# Patient Record
Sex: Female | Born: 1980 | Race: Black or African American | Hispanic: No | Marital: Married | Smoking: Never smoker
Health system: Southern US, Community
[De-identification: ages and names within clinical notes are randomized; demographics above are authoritative.]

## PROBLEM LIST (undated history)

## (undated) ENCOUNTER — Ambulatory Visit: Admission: EM | Payer: No Typology Code available for payment source

## (undated) DIAGNOSIS — G43909 Migraine, unspecified, not intractable, without status migrainosus: Secondary | ICD-10-CM

## (undated) HISTORY — PX: CHOLECYSTECTOMY: SHX55

---

## 2006-07-14 ENCOUNTER — Ambulatory Visit: Payer: Self-pay | Admitting: Obstetrics and Gynecology

## 2006-07-14 ENCOUNTER — Inpatient Hospital Stay (HOSPITAL_COMMUNITY): Admission: AD | Admit: 2006-07-14 | Discharge: 2006-07-16 | Payer: Self-pay | Admitting: Obstetrics and Gynecology

## 2006-10-10 ENCOUNTER — Inpatient Hospital Stay (HOSPITAL_COMMUNITY): Admission: RE | Admit: 2006-10-10 | Discharge: 2006-10-10 | Payer: Self-pay | Admitting: Obstetrics & Gynecology

## 2008-11-01 ENCOUNTER — Inpatient Hospital Stay (HOSPITAL_COMMUNITY): Admission: AD | Admit: 2008-11-01 | Discharge: 2008-11-03 | Payer: Self-pay | Admitting: Obstetrics and Gynecology

## 2010-04-06 LAB — CBC
MCHC: 33.5 g/dL (ref 30.0–36.0)
RDW: 12.9 % (ref 11.5–15.5)

## 2010-04-07 LAB — GLUCOSE, CAPILLARY: Glucose-Capillary: 38 mg/dL — CL (ref 70–99)

## 2010-04-07 LAB — RPR: RPR Ser Ql: NONREACTIVE

## 2010-04-07 LAB — CBC
RBC: 3.48 MIL/uL — ABNORMAL LOW (ref 3.87–5.11)
WBC: 11.5 10*3/uL — ABNORMAL HIGH (ref 4.0–10.5)

## 2010-10-13 LAB — URINALYSIS, ROUTINE W REFLEX MICROSCOPIC
Bilirubin Urine: NEGATIVE
Glucose, UA: NEGATIVE
Hgb urine dipstick: NEGATIVE
Ketones, ur: NEGATIVE
Nitrite: NEGATIVE
Protein, ur: NEGATIVE
Specific Gravity, Urine: 1.025
Urobilinogen, UA: 1
pH: 7

## 2010-10-13 LAB — GC/CHLAMYDIA PROBE AMP, GENITAL
Chlamydia, DNA Probe: POSITIVE — AB
GC Probe Amp, Genital: NEGATIVE

## 2010-10-13 LAB — WET PREP, GENITAL
Trich, Wet Prep: NONE SEEN
Yeast Wet Prep HPF POC: NONE SEEN

## 2010-10-13 LAB — POCT PREGNANCY, URINE
Operator id: 120561
Preg Test, Ur: NEGATIVE

## 2010-10-18 LAB — CBC
HCT: 28.3 — ABNORMAL LOW
HCT: 30 — ABNORMAL LOW
Hemoglobin: 10.2 — ABNORMAL LOW
MCHC: 33.2
Platelets: 207
Platelets: 212
RDW: 13
WBC: 10.7 — ABNORMAL HIGH

## 2010-10-18 LAB — RAPID URINE DRUG SCREEN, HOSP PERFORMED
Barbiturates: NOT DETECTED
Cocaine: NOT DETECTED
Opiates: NOT DETECTED
Tetrahydrocannabinol: NOT DETECTED

## 2020-11-12 ENCOUNTER — Emergency Department (INDEPENDENT_AMBULATORY_CARE_PROVIDER_SITE_OTHER)
Admission: EM | Admit: 2020-11-12 | Discharge: 2020-11-12 | Disposition: A | Discharge status: Home / Self Care | Payer: BC Managed Care – PPO | Source: Home / Self Care | Attending: Family Medicine | Admitting: Family Medicine

## 2020-11-12 ENCOUNTER — Other Ambulatory Visit: Payer: Self-pay

## 2020-11-12 ENCOUNTER — Emergency Department (INDEPENDENT_AMBULATORY_CARE_PROVIDER_SITE_OTHER): Payer: BC Managed Care – PPO

## 2020-11-12 ENCOUNTER — Emergency Department: Admit: 2020-11-12 | Discharge status: Absent without leave | Payer: Self-pay

## 2020-11-12 DIAGNOSIS — N939 Abnormal uterine and vaginal bleeding, unspecified: Secondary | ICD-10-CM

## 2020-11-12 HISTORY — DX: Migraine, unspecified, not intractable, without status migrainosus: G43.909

## 2020-11-12 LAB — POCT URINALYSIS DIP (MANUAL ENTRY)
Bilirubin, UA: NEGATIVE
Glucose, UA: NEGATIVE mg/dL
Ketones, POC UA: NEGATIVE mg/dL
Leukocytes, UA: NEGATIVE
Nitrite, UA: NEGATIVE
Protein Ur, POC: NEGATIVE mg/dL
Spec Grav, UA: 1.025 (ref 1.010–1.025)
Urobilinogen, UA: 0.2 E.U./dL
pH, UA: 7.5 (ref 5.0–8.0)

## 2020-11-12 LAB — POCT URINE PREGNANCY: Preg Test, Ur: NEGATIVE

## 2020-11-12 NOTE — Discharge Instructions (Signed)
Try taking Ibuprofen 200mg , 4 tabs every 8 hours with food for 1 to 2 days (stop taking if symptoms become worse).

## 2020-11-12 NOTE — ED Provider Notes (Signed)
[facility]    CSN: [id number] Arrival date & time: 11/12/20  [id number]      History   Chief Complaint Chief Complaint  Patient presents with   Vaginal Bleeding   Back Pain    HPI Jamie Harrison is a 40 y.o. female.   Patient has a history of recurring intermenstrual vaginal bleeding.  She reports normal periods however.  She visited a GYN in July 2022 where a Pap smear was negative.   Patient's last menstrual period was 10/23/2020 (exact date).  She denies abdominal or pelvic pain.  Yesterday she developed recurrent vaginal bleeding with lower back ache and was concerned because she passed some blood clots today.  She denies nausea/vomiting, fevers, chills, and sweats, and urinary symptoms and feels well otherwise. She denies history of bleeding disorder. Review of records reveals a normal Hgb 13.3 eleven months ago.   The history is provided by the patient.  Vaginal Bleeding Quality:  Dark red, spotting and clots Severity:  Mild Onset quality:  Sudden Duration:  1 day Timing:  Intermittent Progression:  Unchanged Chronicity:  Chronic Menstrual history:  Regular Possible pregnancy: no   Context: at rest and spontaneously   Relieved by:  None tried Worsened by:  Nothing Ineffective treatments:  None tried Associated symptoms: back pain   Associated symptoms: no abdominal pain, no dysuria, no fatigue, no fever, no nausea and no vaginal discharge   Risk factors: no bleeding disorder    Past Medical History:  Diagnosis Date   Migraines     There are no problems to display for this patient.   Past Surgical History:  Procedure Laterality Date   CHOLECYSTECTOMY      OB History   No obstetric history on file.      Home Medications    Prior to Admission medications   Medication Sig Start Date End Date Taking? Authorizing Provider  Atogepant (QULIPTA PO) Take by mouth.   Yes [provider]  Ubrogepant (UBRELVY PO) Take by mouth.   Yes  [provider]    Family History History reviewed. No pertinent family history.  Social History Social History   Tobacco Use   Smoking status: Never   Smokeless tobacco: Never     Allergies   Patient has no known allergies.   Review of Systems Review of Systems  Constitutional:  Negative for appetite change, chills, diaphoresis, fatigue and fever.  Gastrointestinal:  Negative for abdominal pain, nausea and vomiting.  Genitourinary:  Positive for vaginal bleeding. Negative for dysuria, flank pain, frequency, genital sores, hematuria, pelvic pain, urgency, vaginal discharge and vaginal pain.  Musculoskeletal:  Positive for back pain.  Neurological:  Negative for headaches.  Hematological:  Negative for adenopathy.    Physical Exam Triage Vital Signs ED Triage Vitals  Enc Vitals Group     BP 11/12/20 1045 (!) 141/91     Pulse Rate 11/12/20 1045 70     Resp 11/12/20 1045 12     Temp 11/12/20 1045 98.2 F (36.8 C)     Temp Source 11/12/20 1045 Oral     SpO2 11/12/20 1045 100 %     Weight --      Height --      Head Circumference --      Peak Flow --      Pain Score 11/12/20 1047 1     Pain Loc --      Pain Edu? --      Excl. in  GC? --    No data found.  Updated Vital Signs BP (!) 141/91 (BP Location: Right Arm)   Pulse 70   Temp 98.2 F (36.8 C) (Oral)   Resp 12   LMP 10/23/2020 (Exact Date)   SpO2 100%   Visual Acuity Right Eye Distance:   Left Eye Distance:   Bilateral Distance:    Right Eye Near:   Left Eye Near:    Bilateral Near:     Physical Exam Vitals and nursing note reviewed. Exam conducted with a chaperone present.  Constitutional:      General: She is not in acute distress. HENT:     Head: Normocephalic.     Nose: Nose normal.     Mouth/Throat:     Mouth: Mucous membranes are moist.     Pharynx: Oropharynx is clear.  Eyes:     Conjunctiva/sclera: Conjunctivae normal.     Pupils: Pupils are equal, round, and reactive to  light.  Cardiovascular:     Rate and Rhythm: Normal rate and regular rhythm.     Heart sounds: Normal heart sounds.  Pulmonary:     Breath sounds: Normal breath sounds.  Abdominal:     Palpations: Abdomen is soft. There is no mass.     Tenderness: There is no abdominal tenderness. There is no right CVA tenderness, left CVA tenderness or guarding.     Hernia: There is no hernia in the left inguinal area or right inguinal area.  Genitourinary:    General: Normal vulva.     Exam position: Lithotomy position.     Pubic Area: No rash.      Labia:        Right: No rash or tenderness.        Left: No rash or tenderness.      Vagina: Normal.     Cervix: No discharge.     Uterus: Normal. Not enlarged and not tender.      Adnexa: Right adnexa normal and left adnexa normal.       Right: No tenderness.         Left: No tenderness.       Comments: Cervical os has blood present, otherwise no discharge. Musculoskeletal:     Cervical back: Neck supple.     Right lower leg: No edema.     Left lower leg: No edema.  Lymphadenopathy:     Cervical: No cervical adenopathy.     Lower Body: No right inguinal adenopathy. No left inguinal adenopathy.  Skin:    General: Skin is warm and dry.     Findings: No rash.  Neurological:     Mental Status: She is alert and oriented to person, place, and time.     UC Treatments / Results  Labs (all labs ordered are listed, but only abnormal results are displayed) Labs Reviewed  POCT URINALYSIS DIP (MANUAL ENTRY) - Abnormal; Notable for the following components:      Result Value   Blood, UA trace-intact (*)    All other components within normal limits  POCT URINE PREGNANCY negative    EKG   Radiology US Pelvis Complete  Result Date: 11/12/2020 CLINICAL DATA:  Lower abdominal pain and vaginal bleeding. EXAM: TRANSABDOMINAL ULTRASOUND OF PELVIS TECHNIQUE: Transabdominal ultrasound examination of the pelvis was performed including evaluation of the  uterus, ovaries, adnexal regions, and pelvic cul-de-sac. COMPARISON:  None. FINDINGS: Uterus Measurements: 9.6 x 4.7 x 5.4 cm = volume: 127 mL. No fibroids or other  mass visualized. Endometrium Thickness: 10 mm. 7 mm subendometrial cyst. No other focal abnormality visualized. Right ovary Measurements: 4.6 x 2.2 x 2.4 cm = volume: 13.2 mL. Normal appearance/no adnexal mass. Left ovary Measurements: 3.5 x 3.1 x 2.8 cm = volume: 16.0 mL. Normal appearance/no adnexal mass. Other findings:  No abnormal free fluid. IMPRESSION: 1. Essentially normal pelvic ultrasound. Electronically Signed   By: Titus Dubin M.D.   On: 11/12/2020 14:21    Procedures Procedures (including critical care time)  Medications Ordered in UC Medications - No data to display  Initial Impression / Assessment and Plan / UC Course  I have reviewed the triage vital signs and the nursing notes.  Pertinent labs & imaging results that were available during my care of the patient were reviewed by me and considered in my medical decision making (see chart for details).    Normal pelvic US reassuring. Recommend GYN follow-up.  Final Clinical Impressions(s) / UC Diagnoses   Final diagnoses:  Vaginal bleeding     Discharge Instructions      Try taking Ibuprofen 200mg , 4 tabs every 8 hours with food for 1 to 2 days (stop taking if symptoms become worse).   ED Prescriptions   None       Kandra Nicolas, MD 11/14/20 2107

## 2020-11-12 NOTE — ED Triage Notes (Signed)
Pt presents with vaginal bleeding and lower back pain that began yesterday

## 2021-04-11 ENCOUNTER — Encounter (HOSPITAL_COMMUNITY): Payer: Self-pay

## 2021-04-11 ENCOUNTER — Emergency Department (HOSPITAL_COMMUNITY)
Admission: EM | Admit: 2021-04-11 | Discharge: 2021-04-11 | Disposition: A | Discharge status: Home / Self Care | Payer: Federal, State, Local not specified - PPO | Attending: Emergency Medicine | Admitting: Emergency Medicine

## 2021-04-11 DIAGNOSIS — R519 Headache, unspecified: Secondary | ICD-10-CM | POA: Diagnosis present

## 2021-04-11 DIAGNOSIS — R110 Nausea: Secondary | ICD-10-CM | POA: Diagnosis not present

## 2021-04-11 MED ORDER — KETOROLAC TROMETHAMINE 15 MG/ML IJ SOLN
30.0000 mg | Freq: Once | INTRAMUSCULAR | Status: AC
  Administered 2021-04-11: 30 mg via INTRAMUSCULAR
  Filled 2021-04-11: qty 2

## 2021-04-11 NOTE — ED Triage Notes (Signed)
Pt presents with c/o migraine. Pt reports pain since yesterday, hx of same, no relief with home meds.  ?

## 2021-04-11 NOTE — ED Provider Notes (Signed)
?[facility]-EMERGENCY DEPT ?Provider Note ? ? ?CSN: [id number] ?Arrival date & time: 04/11/21  2831 ? ?  ? ?History ? ?Chief Complaint  ?Patient presents with  ? Migraine  ? ? ?Jamie Harrison is a 41 y.o. female. ? ?Patient, seen by myself earlier in triage, presents for evaluation of headache which she describes as a migraine headache.  She has a history of same.  Home medications have not been helping.  Current headache started last night.  It is frontal.  She has associated nausea without vomiting, light sensitivity.  No head injuries, confusion, neck pain or fever.  She drove herself to the emergency department today and does not have a ride home.   ?  ? ? ?  ? ?Home Medications ?Prior to Admission medications   ?Medication Sig Start Date End Date Taking? Authorizing Provider  ?Atogepant (QULIPTA PO) Take by mouth.    [provider]  ?Ubrogepant (UBRELVY PO) Take by mouth.    [provider]  ?   ? ?Allergies    ?Patient has no known allergies.   ? ?Review of Systems   ?Review of Systems ? ?Physical Exam ?Updated Vital Signs ?BP (!) 150/104   Pulse 80   Temp 98.3 ?F (36.8 ?C) (Oral)   Resp 17   SpO2 100%  ?Physical Exam ?Vitals and nursing note reviewed.  ?Constitutional:   ?   Appearance: She is well-developed.  ?HENT:  ?   Head: Normocephalic and atraumatic.  ?   Right Ear: Tympanic membrane, ear canal and external ear normal.  ?   Left Ear: Tympanic membrane, ear canal and external ear normal.  ?   Nose: Nose normal.  ?   Mouth/Throat:  ?   Pharynx: Uvula midline.  ?Eyes:  ?   General: Lids are normal.  ?   Extraocular Movements:  ?   Right eye: No nystagmus.  ?   Left eye: No nystagmus.  ?   Conjunctiva/sclera: Conjunctivae normal.  ?   Pupils: Pupils are equal, round, and reactive to light.  ?Cardiovascular:  ?   Rate and Rhythm: Normal rate and regular rhythm.  ?Pulmonary:  ?   Effort: Pulmonary effort is normal.  ?   Breath sounds: Normal breath sounds.   ?Abdominal:  ?   Palpations: Abdomen is soft.  ?   Tenderness: There is no abdominal tenderness.  ?Musculoskeletal:  ?   Cervical back: Normal range of motion and neck supple. No tenderness or bony tenderness.  ?Skin: ?   General: Skin is warm and dry.  ?Neurological:  ?   Mental Status: She is alert and oriented to person, place, and time.  ?   GCS: GCS eye subscore is 4. GCS verbal subscore is 5. GCS motor subscore is 6.  ?   Cranial Nerves: No cranial nerve deficit.  ?   Sensory: No sensory deficit.  ?   Motor: No weakness.  ?   Coordination: Coordination normal.  ?   Gait: Gait normal.  ?   Comments: Upper extremity myotomes tested bilaterally:  ?C5 Shoulder abduction 5/5 ?C6 Elbow flexion/wrist extension 5/5 ?C7 Elbow extension 5/5 ?C8 Finger flexion 5/5 ?T1 Finger abduction 5/5 ? ?Lower extremity myotomes tested bilaterally: ?L2 Hip flexion 5/5 ?L3 Knee extension 5/5 ?L4 Ankle dorsiflexion 5/5 ?S1 Ankle plantar flexion 5/5 ?  ? ? ?ED Results / Procedures / Treatments   ?Labs ?(all labs ordered are listed, but only abnormal results are displayed) ?Labs Reviewed -  No data to display ? ?EKG ?None ? ?Radiology ?No results found. ? ?Procedures ?Procedures  ? ? ?Medications Ordered in ED ?Medications  ?ketorolac (TORADOL) 15 MG/ML injection 30 mg (30 mg Intramuscular Given 04/11/21 1327)  ? ? ?ED Course/ Medical Decision Making/ A&P ?  ? ?Patient seen and examined.  While waiting in the emergency department waiting room, she states that her headache is improving.  Currently 3/10. ? ?Medications/Fluids: Ordered: Toradol  ? ?Most recent vital signs reviewed and are as follows: ?BP (!) 150/104   Pulse 80   Temp 98.3 ?F (36.8 ?C) (Oral)   Resp 17   SpO2 100%  ? ?Initial impression: Headache ? ?Home treatment plan: Rest and home medications as needed ? ?Return instructions discussed with patient: Patient counseled to return if they have weakness in their arms or legs, slurred speech, trouble walking or talking,  confusion, trouble with their balance, or if they have any other concerns. Patient verbalizes understanding and agrees with plan.  ? ?Follow-up instructions discussed with patient: Follow-up with PCP as needed ? ?                        ?Medical Decision Making ?Risk ?Prescription drug management. ? ? ?In regards to the patient's headache, critical differentials were considered including subarachnoid hemorrhage, intracerebral hemorrhage, epidural/subdural hematoma, pituitary apoplexy, vertebral/carotid artery dissection, giant cell arteritis, central venous thrombosis, reversible cerebral vasoconstriction, acute angle closure glaucoma, idiopathic intracranial hypertension, bacterial meningitis, viral encephalitis, carbon monoxide poisoning, posterior reversible encephalopathy syndrome, pre-eclampsia.  ? ?Reg flag symptoms related to these causes were considered including systemic symptoms (fever, weight loss), neurologic symptoms (confusion, mental status change, vision change, associated seizure), acute or sudden "thunderclap" onset, patient age 66 or older with new or progressive headache, patient of any age with first headache or change in headache pattern, pregnant or postpartum status, history of HIV or other immunocompromise, history of cancer, headache occurring with exertion, associated neck or shoulder pain, associated traumatic injury, concurrent use of anticoagulation, family history of spontaneous SAH, and concurrent drug use.   ? ?Other benign, more common causes of headache were considered including migraine, tension-type headache, cluster headache, referred pain from other cause such as sinus infection, dental pain, trigeminal neuralgia.  ? ?On exam, patient has a reassuring neuro exam including baseline mental status, no significant neck pain or meningeal signs, no signs of severe infection or fever.  ? ?The patient's vital signs, pertinent lab work and imaging were reviewed and interpreted as  discussed in the ED course. Hospitalization was considered for further testing, treatments, or serial exams/observation. However as patient is well-appearing, has a stable exam over the course of their evaluation, and reassuring studies today, I do not feel that they warrant admission at this time. This plan was discussed with the patient who verbalizes agreement and comfort with this plan and seems reliable and able to return to the Emergency Department with worsening or changing symptoms.  ? ? ? ? ? ? ? ? ?Final Clinical Impression(s) / ED Diagnoses ?Final diagnoses:  ?Bad headache  ? ? ?Rx / DC Orders ?ED Discharge Orders   ? ? None  ? ?  ? ? ?  ?Renne Crigler, PA-C ?04/11/21 1350 ? ?  ?Mancel Bale, MD ?04/11/21 1720 ? ?

## 2021-04-11 NOTE — ED Provider Triage Note (Signed)
Emergency Medicine Provider Triage Evaluation Note ? ?Jamie Harrison , a 41 y.o. female  was evaluated in triage.  Pt complains of migraine headache.  She has a history of same.  Home medications have not been helping.  Current headache started last night.  It is frontal.  She has associated nausea without vomiting, light sensitivity.  No head injuries, confusion, neck pain or fever.  She drove herself to the emergency department today and does not have a ride home. ? ?Review of Systems  ?Positive: Headache ?Negative: Nausea ? ?Physical Exam  ?BP (!) 141/99 (BP Location: Left Arm)   Pulse 78   Temp 98.3 ?F (36.8 ?C) (Oral)   Resp 16   SpO2 99%  ?Gen:   Awake, no distress   ?Resp:  Normal effort  ?MSK:   Moves extremities without difficulty  ?Other:   ? ?Medical Decision Making  ?Medically screening exam initiated at 10:06 AM.  Appropriate orders placed.  Milani A Bridgett was informed that the remainder of the evaluation will be completed by another provider, this initial triage assessment does not replace that evaluation, and the importance of remaining in the ED until their evaluation is complete. ? ? ?  ?Renne Crigler, PA-C ?04/11/21 1007 ? ?

## 2021-04-11 NOTE — Discharge Instructions (Signed)
Please read and follow all provided instructions. ? ?Your diagnoses today include:  ?1. Bad headache   ? ? ?Tests performed today include: ?Vital signs. See below for your results today.  ? ?Medications:  ?In the Emergency Department you received: ?Toradol - NSAID medication similar to ibuprofen ? ?Take any prescribed medications only as directed. ? ?Additional information:  ?Follow any educational materials contained in this packet. ? ?You are having a headache. No specific cause was found today for your headache. It may have been a migraine or other cause of headache. Stress, anxiety, fatigue, and depression are common triggers for headaches.  ? ?Your headache today does not appear to be life-threatening or require hospitalization, but often the exact cause of headaches is not determined in the emergency department. Therefore, follow-up with your doctor is very important to find out what may have caused your headache and whether or not you need any further diagnostic testing or treatment.  ? ?Sometimes headaches can appear benign (not harmful), but then more serious symptoms can develop which should prompt an immediate re-evaluation by your doctor or the emergency department. ? ?BE VERY CAREFUL not to take multiple medicines containing Tylenol (also called acetaminophen). Doing so can lead to an overdose which can damage your liver and cause liver failure and possibly death.  ? ?Follow-up instructions: ?Please follow-up with your primary care provider in the next 3 days for further evaluation of your symptoms.  ? ?Return instructions:  ?Please return to the Emergency Department if you experience worsening symptoms. ?Return if the medications do not resolve your headache, if it recurs, or if you have multiple episodes of vomiting or cannot keep down fluids. ?Return if you have a change from the usual headache. ?RETURN IMMEDIATELY IF you: ?Develop a sudden, severe headache ?Develop confusion or become poorly  responsive or faint ?Develop a fever above 100.70F or problem breathing ?Have a change in speech, vision, swallowing, or understanding ?Develop new weakness, numbness, tingling, incoordination in your arms or legs ?Have a seizure ?Please return if you have any other emergent concerns. ? ?Additional Information: ? ?Your vital signs today were: ?BP (!) 150/104   Pulse 80   Temp 98.3 ?F (36.8 ?C) (Oral)   Resp 17   SpO2 100%  ?If your blood pressure (BP) was elevated above 135/85 this visit, please have this repeated by your doctor within one month. ?-------------- ? ?

## 2021-05-12 ENCOUNTER — Ambulatory Visit (HOSPITAL_COMMUNITY)
Admission: EM | Admit: 2021-05-12 | Discharge: 2021-05-12 | Disposition: A | Discharge status: Home / Self Care | Payer: No Typology Code available for payment source | Attending: Family Medicine | Admitting: Family Medicine

## 2021-05-12 ENCOUNTER — Encounter (HOSPITAL_COMMUNITY): Payer: Self-pay | Admitting: Emergency Medicine

## 2021-05-12 DIAGNOSIS — G43819 Other migraine, intractable, without status migrainosus: Secondary | ICD-10-CM

## 2021-05-12 MED ORDER — METOCLOPRAMIDE HCL 5 MG/ML IJ SOLN
INTRAMUSCULAR | Status: AC
  Filled 2021-05-12: qty 2

## 2021-05-12 MED ORDER — KETOROLAC TROMETHAMINE 30 MG/ML IJ SOLN
INTRAMUSCULAR | Status: AC
  Filled 2021-05-12: qty 1

## 2021-05-12 MED ORDER — KETOROLAC TROMETHAMINE 30 MG/ML IJ SOLN
30.0000 mg | Freq: Once | INTRAMUSCULAR | Status: AC
  Administered 2021-05-12: 30 mg via INTRAMUSCULAR

## 2021-05-12 MED ORDER — ONDANSETRON 4 MG PO TBDP
ORAL_TABLET | ORAL | Status: AC
  Filled 2021-05-12: qty 1

## 2021-05-12 MED ORDER — DEXAMETHASONE SODIUM PHOSPHATE 10 MG/ML IJ SOLN
INTRAMUSCULAR | Status: AC
  Filled 2021-05-12: qty 1

## 2021-05-12 MED ORDER — DEXAMETHASONE SODIUM PHOSPHATE 10 MG/ML IJ SOLN
10.0000 mg | Freq: Once | INTRAMUSCULAR | Status: AC
  Administered 2021-05-12: 10 mg via INTRAMUSCULAR

## 2021-05-12 MED ORDER — METOCLOPRAMIDE HCL 5 MG/ML IJ SOLN
5.0000 mg | Freq: Once | INTRAMUSCULAR | Status: AC
  Administered 2021-05-12: 5 mg via INTRAMUSCULAR

## 2021-05-12 MED ORDER — ONDANSETRON 4 MG PO TBDP
4.0000 mg | ORAL_TABLET | Freq: Once | ORAL | Status: AC
  Administered 2021-05-12: 4 mg via ORAL

## 2021-05-12 NOTE — Discharge Instructions (Signed)
Meds ordered this encounter  ?Medications  ? ketorolac (TORADOL) 30 MG/ML injection 30 mg  ? metoCLOPramide (REGLAN) injection 5 mg  ? dexamethasone (DECADRON) injection 10 mg  ? ondansetron (ZOFRAN-ODT) disintegrating tablet 4 mg  ? ? ?

## 2021-05-12 NOTE — ED Provider Notes (Signed)
?[facility] ? ? ?[id number] ?05/12/21 Arrival Time: 1038 ? ?ASSESSMENT & PLAN: ? ?1. Other migraine without status migrainosus, intractable   ? ?Meds ordered this encounter  ?Medications  ? ketorolac (TORADOL) 30 MG/ML injection 30 mg  ? metoCLOPramide (REGLAN) injection 5 mg  ? dexamethasone (DECADRON) injection 10 mg  ? ondansetron (ZOFRAN-ODT) disintegrating tablet 4 mg  ? ?Normal neurological exam. Afebrile without nuchal rigidity. Without fever, focal neuro logical deficits, nuchal rigidity, or change in vision. No indication for neurodiagnostic workup at this time. Discussed. ? ?Requests neurology referral for frequent migraines. ?No current PCP. ?Orders Placed This Encounter  ?Procedures  ? Ambulatory referral to Neurology  ?  Referral Priority:   Routine  ?  Referral Type:   Consultation  ?  Referral Reason:   Specialty Services Required  ?  Requested Specialty:   Neurology  ?  Number of Visits Requested:   1  ? ?Recommend: ? Follow-up Information   ? ? [facility].   ?Specialty: Urgent Care ?Why: As needed. ?Contact information: ?[address] ?[place] [address] ?[phone] ? ?  ?  ? ?  ?  ? ?  ? ?Reviewed expectations re: course of current medical issues. Questions answered. ?Outlined signs and symptoms indicating need for more acute intervention. ?Patient verbalized understanding. ?After Visit Summary given. ? ? ?SUBJECTIVE: ?History from: Patient. ?Patient is able to give a clear and coherent history. ? ?Jamie Harrison is a 41 y.o. female who presents with complaint of a migraine headache. Onset gradual, yesterday. Location:  generalized  without radiation. History of headaches: yes; frequent with similar symptoms. Precipitating factors include: none which have been determined. ?Associated symptoms: ?Preceding aura: none specifically reported. ?Nausea/vomiting: nausea without emesis. ?Vision changes: no. ?Increased sensitivity to light and to  noises: yes. ?Fever: no. ?Sinus pressure/congestion: no. ?Extremity weakness: no. ?Home treatment has included  Imitrex  with little improvement. ?Current headache has limited normal daily activities. ?Denies depression, dizziness, loss of balance, muscle weakness, numbness of extremities, and speech difficulties. ?No head injury reported. Ambulatory without difficulty. ?No recent travel. ?Denies head trauma. ? ?OBJECTIVE: ? ?Vitals:  ? 05/12/21 1142 05/12/21 1143  ?BP:  122/66  ?Pulse: 80   ?Resp: 17   ?Temp: 98.6 ?F (37 ?C)   ?TempSrc: Oral   ?SpO2: 100%   ?  ?General appearance: alert; NAD but appears fatigued ?HENT: normocephalic; atraumatic ?Eyes: PERRLA; EOMI; conjunctivae normal ?Neck: supple with FROM ?Lungs: clear to auscultation bilaterally; unlabored respirations ?Heart: regular rate and rhythm ?Extremities: no edema; symmetrical with no gross deformities ?Skin: warm and dry ?Neurologic: alert; speech is fluent and clear without dysarthria or aphasia; CN 2-12 grossly intact; no facial droop; normal gait; normal extremity strength and sensation throughout ?Psychological: alert and cooperative; normal mood and affect ? ? ?No Known Allergies ? ?Past Medical History:  ?Diagnosis Date  ? Migraines   ? ?Social History  ? ?Socioeconomic History  ? Marital status: Married  ?  Spouse name: Not on file  ? Number of children: Not on file  ? Years of education: Not on file  ? Highest education level: Not on file  ?Occupational History  ? Not on file  ?Tobacco Use  ? Smoking status: Never  ? Smokeless tobacco: Never  ?Substance and Sexual Activity  ? Alcohol use: Not on file  ? Drug use: Not on file  ? Sexual activity: Not on file  ?Other Topics Concern  ? Not on  file  ?Social History Narrative  ? Not on file  ? ?Social Determinants of Health  ? ?Financial Resource Strain: Not on file  ?Food Insecurity: Not on file  ?Transportation Needs: Not on file  ?Physical Activity: Not on file  ?Stress: Not on file  ?Social  Connections: Not on file  ?Intimate Partner Violence: Not on file  ? ?History reviewed. No pertinent family history. ?Past Surgical History:  ?Procedure Laterality Date  ? CHOLECYSTECTOMY    ? ? ?  ?Vanessa Kick, MD ?05/12/21 1312 ? ?

## 2021-05-12 NOTE — ED Triage Notes (Signed)
Pt is present today with c/o migraine and nausea. Pt sx started this morning. Pt states she took Imitrex this morning and no relief  ?
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
## 2021-06-15 ENCOUNTER — Ambulatory Visit: Payer: No Typology Code available for payment source | Admitting: Psychiatry

## 2021-07-06 ENCOUNTER — Ambulatory Visit: Payer: No Typology Code available for payment source | Admitting: Psychiatry

## 2021-07-06 ENCOUNTER — Encounter: Payer: Self-pay | Admitting: Psychiatry

## 2023-03-13 IMAGING — US US PELVIS COMPLETE
1 series · 14 of 25 positions shown · non-contrast
Comparison: None.

CLINICAL DATA: Lower abdominal pain and vaginal bleeding.

EXAM:
TRANSABDOMINAL ULTRASOUND OF PELVIS
TECHNIQUE: Transabdominal ultrasound examination of the pelvis was performed
including evaluation of the uterus, ovaries, adnexal regions, and
pelvic cul-de-sac.

[Series 1: us pelvis (transabdominal only) · 56 acquisitions, 14 frames shown]
[im 1/56]
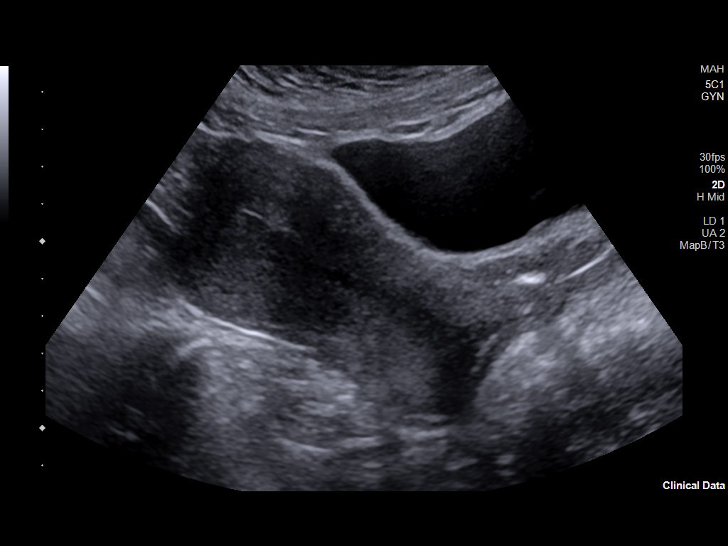
[im 5/56]
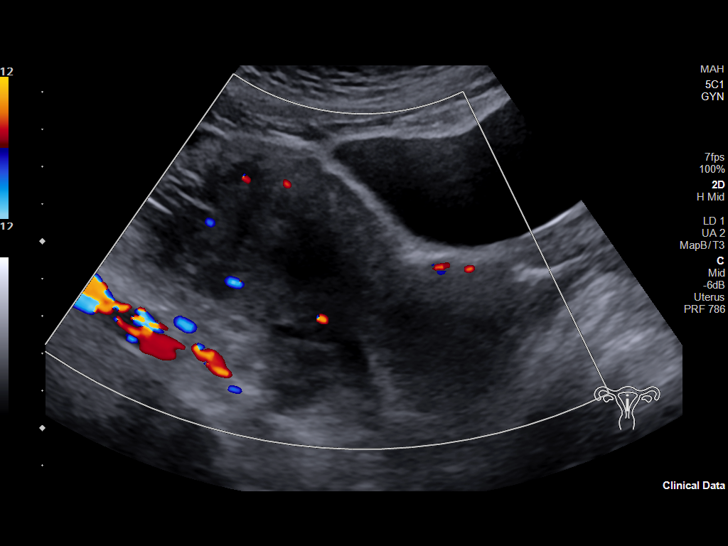
[im 10/56]
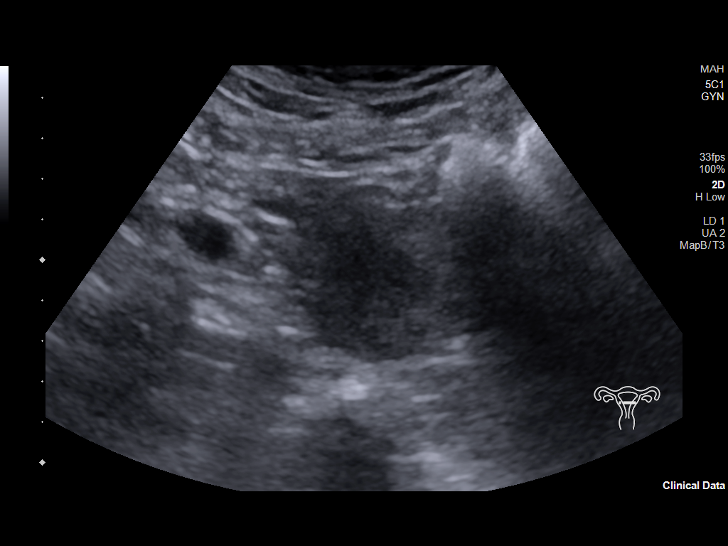
[im 14/56]
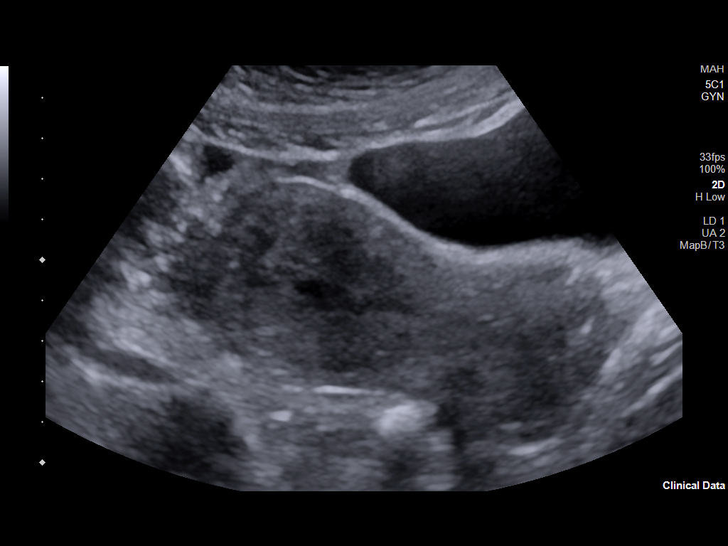
[im 19/56]
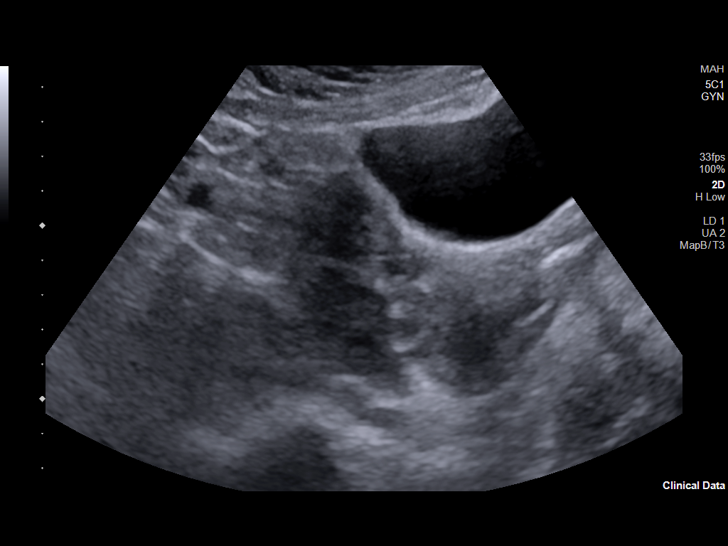
[im 21/56]
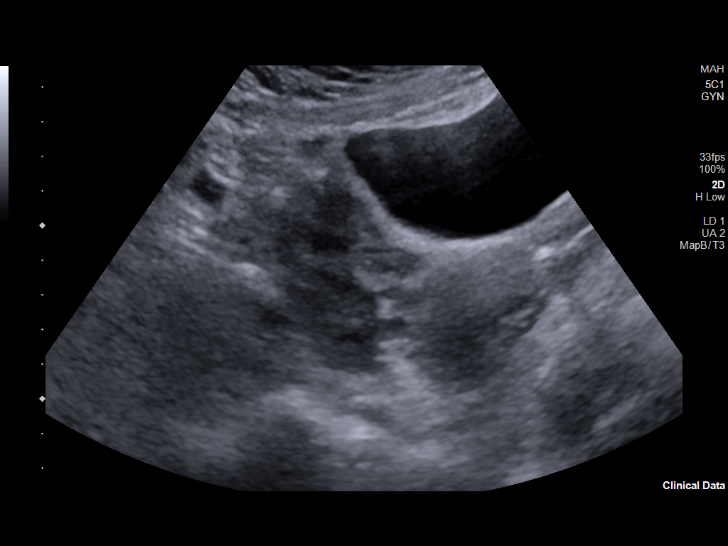
[im 26/56]
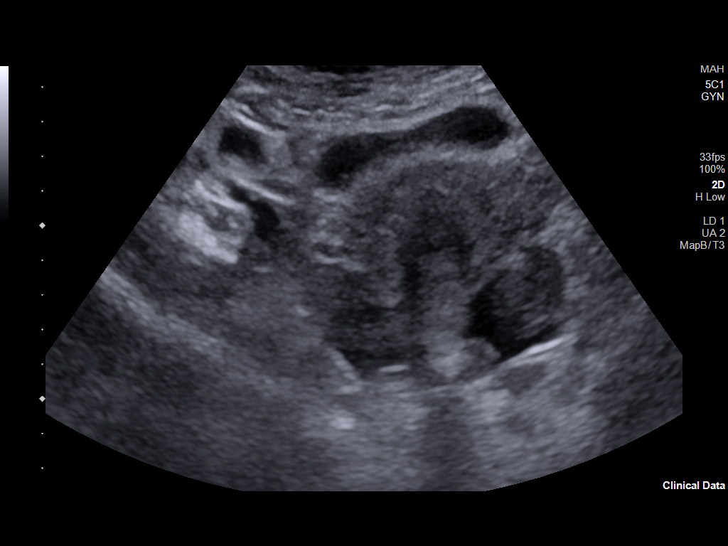
[im 30/56]
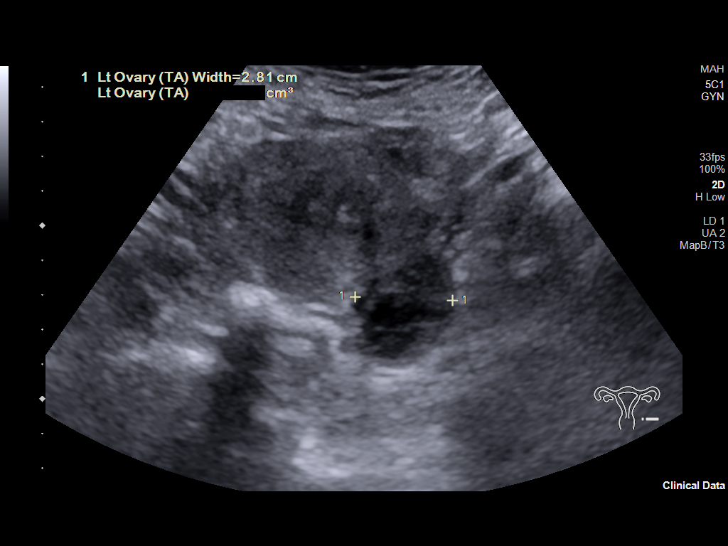
[im 35/56]
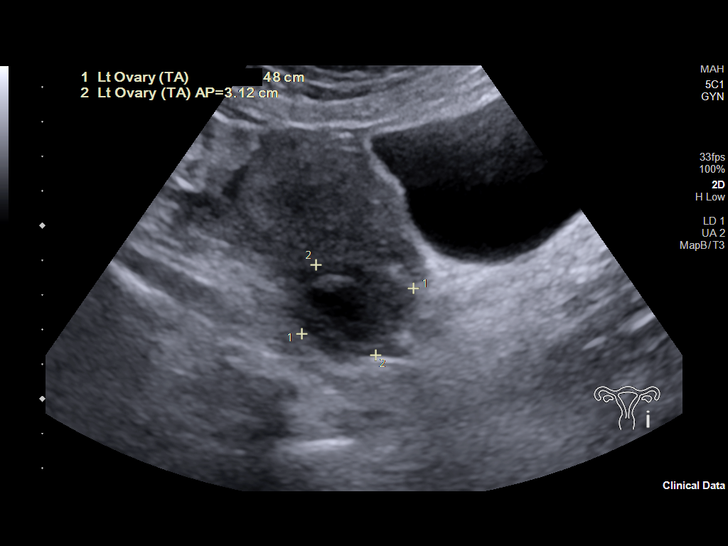
[im 37/56]
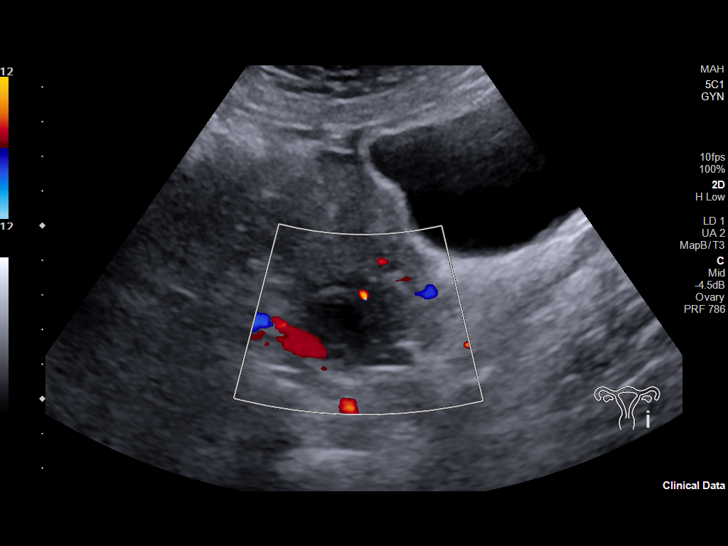
[im 42/56]
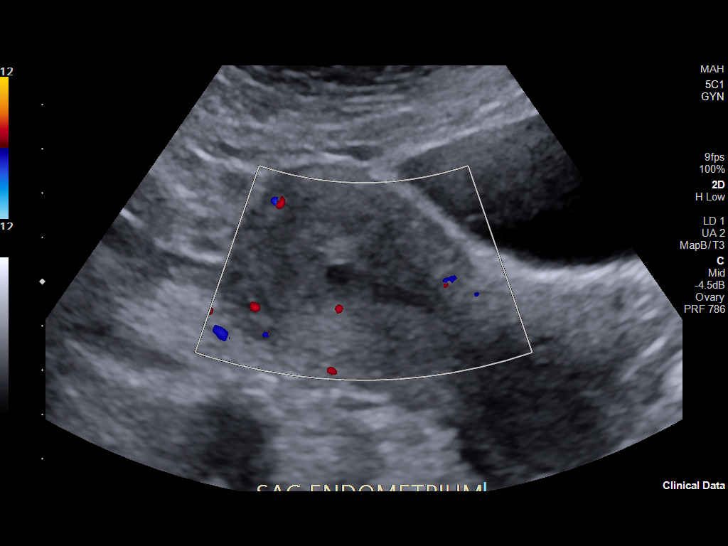
[im 46/56]
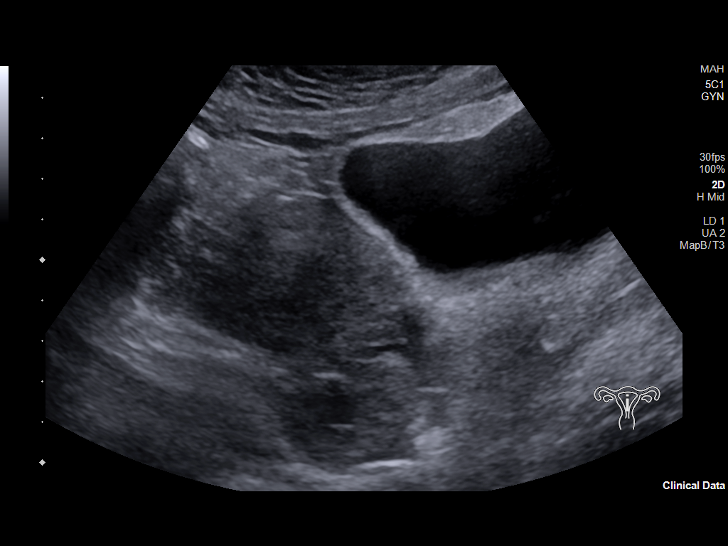
[im 51/56]
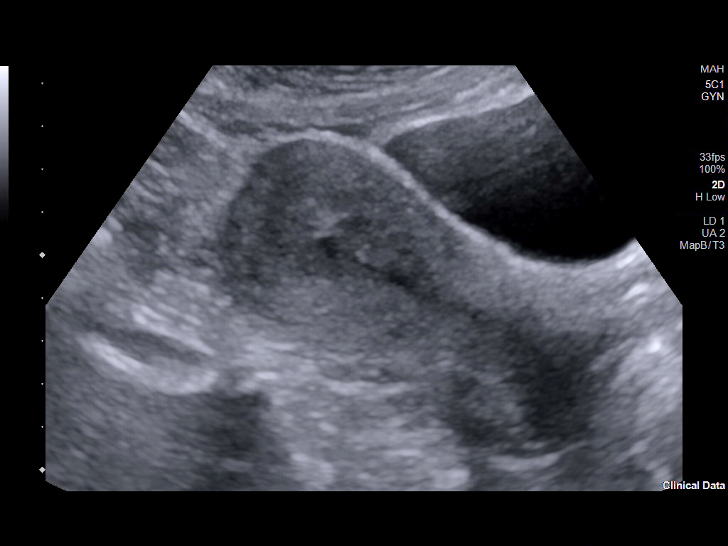
[im 56/56]
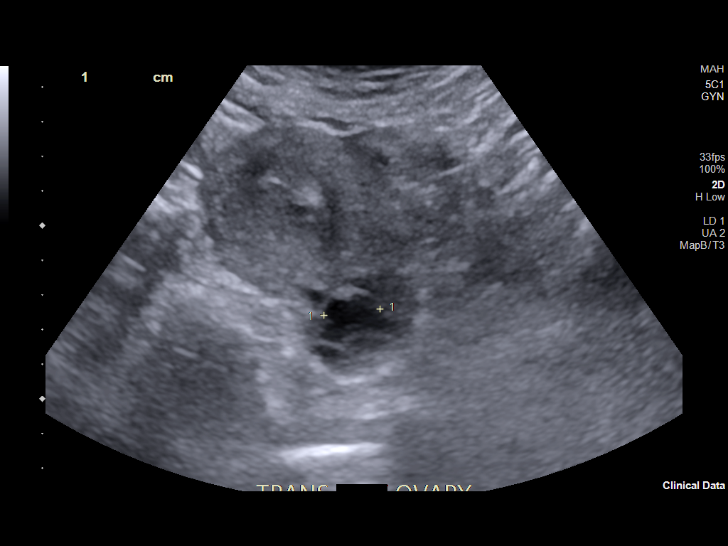

[14 of 25 positions shown; findings below may reference images not displayed]

FINDINGS: Uterus

Measurements: 9.6 x 4.7 x 5.4 cm = volume: 127 mL. No fibroids or
other mass visualized.

Endometrium

Thickness: 10 mm. 7 mm subendometrial cyst. No other focal
abnormality visualized.

Right ovary

Measurements: 4.6 x 2.2 x 2.4 cm = volume: 13.2 mL. Normal
appearance/no adnexal mass.

Left ovary

Measurements: 3.5 x 3.1 x 2.8 cm = volume: 16.0 mL. Normal
appearance/no adnexal mass.

Other findings:  No abnormal free fluid.
IMPRESSION: 1. Essentially normal pelvic ultrasound.

## 2023-05-05 ENCOUNTER — Ambulatory Visit
Admission: RE | Admit: 2023-05-05 | Discharge: 2023-05-05 | Disposition: A | Discharge status: Home / Self Care | Source: Ambulatory Visit | Attending: Family Medicine | Admitting: Family Medicine

## 2023-05-05 ENCOUNTER — Other Ambulatory Visit: Payer: Self-pay

## 2023-05-05 VITALS — BP 126/93 | HR 133 | Temp 99.6°F | Resp 17

## 2023-05-05 DIAGNOSIS — R509 Fever, unspecified: Secondary | ICD-10-CM | POA: Diagnosis not present

## 2023-05-05 DIAGNOSIS — J111 Influenza due to unidentified influenza virus with other respiratory manifestations: Secondary | ICD-10-CM | POA: Diagnosis not present

## 2023-05-05 DIAGNOSIS — J029 Acute pharyngitis, unspecified: Secondary | ICD-10-CM

## 2023-05-05 LAB — POCT INFLUENZA A/B
Influenza A, POC: NEGATIVE
Influenza B, POC: NEGATIVE

## 2023-05-05 LAB — POC SARS CORONAVIRUS 2 AG -  ED: SARS Coronavirus 2 Ag: NEGATIVE

## 2023-05-05 MED ORDER — ACETAMINOPHEN 500 MG PO TABS
1000.0000 mg | ORAL_TABLET | ORAL | Status: AC
  Administered 2023-05-05: 1000 mg via ORAL

## 2023-05-05 MED ORDER — PREDNISONE 20 MG PO TABS: ORAL_TABLET | ORAL | 0 refills | Status: AC

## 2023-05-05 NOTE — ED Provider Notes (Signed)
 [facility]    CSN: [id number] Arrival date & time: 05/05/23  8295      History   Chief Complaint Chief Complaint  Patient presents with   Fever   Generalized Body Aches   Sore Throat   Nasal Congestion    HPI Jamie Harrison is a 43 y.o. female.   HPI pleasant 43 year old female presents with fever, body aches, sore throat, runny nose for 2 days.  PMH significant for migraines.  Past Medical History:  Diagnosis Date   Migraines     There are no active problems to display for this patient.   Past Surgical History:  Procedure Laterality Date   CHOLECYSTECTOMY      OB History   No obstetric history on file.      Home Medications    Prior to Admission medications   Medication Sig Start Date End Date Taking? Authorizing Provider  predniSONE (DELTASONE) 20 MG tablet Take 3 tabs PO daily x 5 days. 05/05/23  Yes Leonides Ramp, FNP    Family History History reviewed. No pertinent family history.  Social History Social History   Tobacco Use   Smoking status: Never   Smokeless tobacco: Never     Allergies   Patient has no known allergies.   Review of Systems Review of Systems   Physical Exam Triage Vital Signs ED Triage Vitals  Encounter Vitals Group     BP 05/05/23 0944 (!) 126/93     Systolic BP Percentile --      Diastolic BP Percentile --      Pulse Rate 05/05/23 0944 (!) 133     Resp 05/05/23 0944 17     Temp 05/05/23 0944 99.6 F (37.6 C)     Temp Source 05/05/23 0944 Oral     SpO2 05/05/23 0944 93 %     Weight --      Height --      Head Circumference --      Peak Flow --      Pain Score 05/05/23 0945 1     Pain Loc --      Pain Education --      Exclude from Growth Chart --    No data found.  Updated Vital Signs BP (!) 126/93 (BP Location: Right Arm)   Pulse (!) 133   Temp 99.6 F (37.6 C) (Oral)   Resp 17   LMP  (LMP Unknown)   SpO2 93%   Visual Acuity Right Eye Distance:   Left Eye Distance:    Bilateral Distance:    Right Eye Near:   Left Eye Near:    Bilateral Near:     Physical Exam Vitals and nursing note reviewed.  Constitutional:      Appearance: Normal appearance. She is normal weight.  HENT:     Head: Normocephalic and atraumatic.     Mouth/Throat:     Mouth: Mucous membranes are moist.     Pharynx: Oropharynx is clear.  Eyes:     Extraocular Movements: Extraocular movements intact.     Conjunctiva/sclera: Conjunctivae normal.     Pupils: Pupils are equal, round, and reactive to light.  Cardiovascular:     Rate and Rhythm: Normal rate and regular rhythm.     Pulses: Normal pulses.     Heart sounds: Normal heart sounds.  Pulmonary:     Effort: Pulmonary effort is normal.     Breath sounds: Normal breath sounds. No wheezing, rhonchi or rales.  Musculoskeletal:        General: Normal range of motion.     Cervical back: Normal range of motion and neck supple.  Skin:    General: Skin is warm and dry.  Neurological:     General: No focal deficit present.     Mental Status: She is alert and oriented to person, place, and time. Mental status is at baseline.  Psychiatric:        Mood and Affect: Mood normal.        Behavior: Behavior normal.      UC Treatments / Results  Labs (all labs ordered are listed, but only abnormal results are displayed) Labs Reviewed  POC SARS CORONAVIRUS 2 AG -  ED  POCT INFLUENZA A/B    EKG   Radiology No results found.  Procedures Procedures (including critical care time)  Medications Ordered in UC Medications  acetaminophen (TYLENOL) tablet 1,000 mg (1,000 mg Oral Given 05/05/23 0957)    Initial Impression / Assessment and Plan / UC Course  I have reviewed the triage vital signs and the nursing notes.  Pertinent labs & imaging results that were available during my care of the patient were reviewed by me and considered in my medical decision making (see chart for details).     MDM: 1.  Influenza-like  illness-COVID-19 and influenza A and B were both negative this morning; 2.  Fever, unspecified-Tylenol 1 g given once in clinic and prior to discharge.  Advised may take OTC Tylenol 1000 mg every 6 hours for fever (oral temperature greater than 100.4).  3.  Sore throat-Rx'd prednisone 20 mg tablet: Take 3 tabs p.o. daily x 5 days. Advised patient to take medication as directed with food to completion.  Advised may take OTC Tylenol 1000 mg every 6 hours for fever (oral temperature greater than 100.4).  Encouraged to increase daily water intake to 64 ounces per day while taking these medications.  Advised if symptoms worsen and/or unresolved please follow-up with your PCP or here for further evaluation.  Patient discharged home, hemodynamically stable. Final Clinical Impressions(s) / UC Diagnoses   Final diagnoses:  Influenza-like illness  Fever, unspecified  Sore throat     Discharge Instructions      Advised patient to take medication as directed with food to completion.  Advised may take OTC Tylenol 1000 mg every 6 hours for fever (oral temperature greater than 100.4).  Encouraged to increase daily water intake to 64 ounces per day while taking these medications.  Advised if symptoms worsen and/or unresolved please follow-up with your PCP or here for further evaluation.     ED Prescriptions     Medication Sig Dispense Auth. Provider   predniSONE (DELTASONE) 20 MG tablet Take 3 tabs PO daily x 5 days. 15 tablet Izell Labat, FNP      PDMP not reviewed this encounter.   Leonides Ramp, FNP 05/05/23 1035

## 2023-05-05 NOTE — Discharge Instructions (Addendum)
 Advised patient to take medication as directed with food to completion.  Advised may take OTC Tylenol 1000 mg every 6 hours for fever (oral temperature greater than 100.4).  Encouraged to increase daily water intake to 64 ounces per day while taking these medications.  Advised if symptoms worsen and/or unresolved please follow-up with your PCP or here for further evaluation.

## 2023-05-05 NOTE — ED Triage Notes (Signed)
 Pt c/o fever, bodyaches, sore throat and runny nose since Thurs. Taking dayquil prn.
